# Patient Record
Sex: Male | Born: 2000
Health system: Southern US, Community
[De-identification: ages and names within clinical notes are randomized; demographics above are authoritative.]

## PROBLEM LIST (undated history)

## (undated) DIAGNOSIS — J45909 Unspecified asthma, uncomplicated: Secondary | ICD-10-CM

## (undated) DIAGNOSIS — E213 Hyperparathyroidism, unspecified: Secondary | ICD-10-CM

## (undated) DIAGNOSIS — F329 Major depressive disorder, single episode, unspecified: Secondary | ICD-10-CM

## (undated) HISTORY — DX: Unspecified asthma, uncomplicated: J45.909

## (undated) HISTORY — PX: THYROID LOBECTOMY: SHX420

## (undated) HISTORY — DX: Hyperparathyroidism, unspecified: E21.3

## (undated) HISTORY — PX: PARATHYROIDECTOMY: SHX19

---
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
## 2022-02-08 ENCOUNTER — Other Ambulatory Visit: Payer: Self-pay | Admitting: Family Medicine

## 2022-07-09 ENCOUNTER — Other Ambulatory Visit: Payer: Self-pay | Admitting: Family Medicine

## 2022-07-10 NOTE — Telephone Encounter (Signed)
Patient scheduled.

## 2022-07-10 NOTE — Telephone Encounter (Signed)
Please schedule CPE with Dr. Para March after 07/26/22.

## 2022-07-10 NOTE — Telephone Encounter (Signed)
LVM for patient to call back and schedule

## 2022-08-10 ENCOUNTER — Encounter: Payer: Self-pay | Admitting: Family Medicine

## 2022-08-10 ENCOUNTER — Ambulatory Visit (INDEPENDENT_AMBULATORY_CARE_PROVIDER_SITE_OTHER): Payer: BC Managed Care – PPO | Admitting: Family Medicine

## 2022-08-10 VITALS — BP 124/70 | HR 82 | Temp 98.2°F | Ht 71.11 in | Wt 202.0 lb

## 2022-08-10 DIAGNOSIS — Z0000 Encounter for general adult medical examination without abnormal findings: Secondary | ICD-10-CM

## 2022-08-10 DIAGNOSIS — E039 Hypothyroidism, unspecified: Secondary | ICD-10-CM

## 2022-08-10 DIAGNOSIS — E210 Primary hyperparathyroidism: Secondary | ICD-10-CM | POA: Diagnosis not present

## 2022-08-10 DIAGNOSIS — Z7189 Other specified counseling: Secondary | ICD-10-CM

## 2022-08-10 NOTE — Patient Instructions (Addendum)
Go to the lab on the way out.   If you have mychart we'll likely use that to update you.    Take care.  Glad to see you. I would get a flu shot each fall.   

## 2022-08-10 NOTE — Progress Notes (Signed)
CPE- See plan.  Routine anticipatory guidance given to patient.  See health maintenance.  The possibility exists that previously documented standard health maintenance information may have been brought forward from a previous encounter into this note.  If needed, that same information has been updated to reflect the current situation based on today's encounter.     Tetanus 2016 Flu d/w pt.  PNA and shingles not due.  Colon and prostate screening not due.   Living will d/w pt.  Mother designated if patient were incapacitated.   Diet and exercise d/w pt.     Hyperparathyroid hx.  Calcium and PTH pending.  D/w pt.     Hypothyroidism.  Taking med in AM on empty stomach.  Compliant.  He doesn't usually miss a dose.  No neck mass, no dysphagia.  No sig fatigue, at baseline.  TSH pending.   See exam regarding pain concern. L 3rd prox phalanx discomfort w/o tendon deficit.   L>R 2nd finger PIP dorsal skin thickening.  I asked him to update me as needed.   PMH and SH reviewed   Meds, vitals, and allergies reviewed.    ROS: Per HPI.  Unless specifically indicated otherwise in HPI, the patient denies:   General: fever. Eyes: acute vision changes ENT: sore throat Cardiovascular: chest pain Respiratory: SOB GI: vomiting GU: dysuria Musculoskeletal: acute back pain Derm: acute rash Neuro: acute motor dysfunction Psych: worsening mood Endocrine: polydipsia Heme: bleeding Allergy: hayfever   GEN: nad, alert and oriented HEENT: mucous membranes moist NECK: supple w/o LA CV: rrr. PULM: ctab, no inc wob ABD: soft, +bs EXT: no edema SKIN: no acute rash L 3rd prox phalanx discomfort w/o tendon deficit.   L>R 2nd finger PIP dorsal skin thickening without ulceration. No contracture.  No erythema or bruising.  No joint line pain.

## 2022-08-13 NOTE — Assessment & Plan Note (Signed)
Tetanus 2016 Flu d/w pt.  PNA and shingles not due.  Colon and prostate screening not due.   Living will d/w pt.  Mother designated if patient were incapacitated.   Diet and exercise d/w pt.

## 2022-08-13 NOTE — Assessment & Plan Note (Signed)
History of, calcium and PTH pending.  D/w pt.

## 2022-08-13 NOTE — Assessment & Plan Note (Signed)
Living will d/w pt. Mother designated if patient were incapacitated.  

## 2022-08-13 NOTE — Assessment & Plan Note (Addendum)
Continue levothyroxine.  See notes on labs. 

## 2022-11-07 ENCOUNTER — Other Ambulatory Visit: Payer: Self-pay | Admitting: Family Medicine

## 2023-08-02 ENCOUNTER — Other Ambulatory Visit: Payer: Self-pay | Admitting: Family Medicine

## 2023-08-02 DIAGNOSIS — E039 Hypothyroidism, unspecified: Secondary | ICD-10-CM

## 2023-08-03 NOTE — Telephone Encounter (Signed)
 E-scribed refill  Plz schedule CPE and fasting labs (no food/drink- except water and/or blk coffee 5 hrs prior) for additional refills.

## 2023-08-20 ENCOUNTER — Other Ambulatory Visit: Payer: Self-pay | Admitting: Family Medicine

## 2023-08-20 DIAGNOSIS — E039 Hypothyroidism, unspecified: Secondary | ICD-10-CM

## 2023-08-20 DIAGNOSIS — E210 Primary hyperparathyroidism: Secondary | ICD-10-CM

## 2023-08-31 ENCOUNTER — Other Ambulatory Visit (INDEPENDENT_AMBULATORY_CARE_PROVIDER_SITE_OTHER)

## 2023-08-31 DIAGNOSIS — E210 Primary hyperparathyroidism: Secondary | ICD-10-CM

## 2023-08-31 DIAGNOSIS — E039 Hypothyroidism, unspecified: Secondary | ICD-10-CM | POA: Diagnosis not present

## 2023-08-31 LAB — BASIC METABOLIC PANEL WITH GFR
BUN: 13 mg/dL (ref 6–23)
CO2: 28 meq/L (ref 19–32)
Calcium: 9.6 mg/dL (ref 8.4–10.5)
Chloride: 101 meq/L (ref 96–112)
Creatinine, Ser: 1.08 mg/dL (ref 0.40–1.50)
GFR: 98.55 mL/min (ref >60.00)
Glucose, Bld: 91 mg/dL (ref 70–99)
Potassium: 3.9 meq/L (ref 3.5–5.1)
Sodium: 139 meq/L (ref 135–145)

## 2023-08-31 LAB — TSH: TSH: 1.01 u[IU]/mL (ref 0.35–5.50)

## 2023-08-31 LAB — VITAMIN D 25 HYDROXY (VIT D DEFICIENCY, FRACTURES): VITD: 21.74 ng/mL — ABNORMAL LOW (ref 30.00–100.00)

## 2023-09-01 LAB — PTH, INTACT AND CALCIUM
Calcium: 9.9 mg/dL (ref 8.6–10.3)
PTH: 10 pg/mL — ABNORMAL LOW (ref 16–77)

## 2023-09-03 ENCOUNTER — Ambulatory Visit: Payer: Self-pay | Admitting: Family Medicine

## 2023-09-07 ENCOUNTER — Encounter: Payer: Self-pay | Admitting: Family Medicine

## 2023-09-07 ENCOUNTER — Ambulatory Visit (INDEPENDENT_AMBULATORY_CARE_PROVIDER_SITE_OTHER): Admitting: Family Medicine

## 2023-09-07 VITALS — BP 128/66 | HR 103 | Temp 98.1°F | Ht 70.5 in | Wt 205.2 lb

## 2023-09-07 DIAGNOSIS — Z8639 Personal history of other endocrine, nutritional and metabolic disease: Secondary | ICD-10-CM

## 2023-09-07 DIAGNOSIS — E039 Hypothyroidism, unspecified: Secondary | ICD-10-CM

## 2023-09-07 DIAGNOSIS — Z0000 Encounter for general adult medical examination without abnormal findings: Secondary | ICD-10-CM

## 2023-09-07 DIAGNOSIS — Z7189 Other specified counseling: Secondary | ICD-10-CM

## 2023-09-07 DIAGNOSIS — E559 Vitamin D deficiency, unspecified: Secondary | ICD-10-CM

## 2023-09-07 MED ORDER — LEVOTHYROXINE SODIUM 75 MCG PO TABS: ORAL_TABLET | ORAL | 3 refills | Status: AC

## 2023-09-07 NOTE — Patient Instructions (Signed)
 Restart vitamin D  and recheck nonfasting labs in about 3 months.  Take care.  Glad to see you. I would get a flu shot each fall.   Recheck yearly.

## 2023-09-07 NOTE — Progress Notes (Signed)
 CPE- See plan.  Routine anticipatory guidance given to patient.  See health maintenance.  The possibility exists that previously documented standard health maintenance information may have been brought forward from a previous encounter into this note.  If needed, that same information has been updated to reflect the current situation based on today's encounter.    Tetanus 2016 Flu d/w pt. encouraged. PNA and shingles not due.  Colon and prostate screening not due.   Living will d/w pt.  Mother designated if patient were incapacitated.   Diet and exercise d/w pt.    Mildly low PTH with normal calcium .  History of hyperparathyroidism previously corrected with surgery.  Labs discussed with patient.  He is feeling well otherwise.  Low vit D.  He had missed some doses, d/w pt about options.    Still on levothyroxine .  TSH wnl.  Compliant.   No ADE on med.  No neck mass noted.  PMH and SH reviewed  Meds, vitals, and allergies reviewed.   ROS: Per HPI.  Unless specifically indicated otherwise in HPI, the patient denies:  General: fever. Eyes: acute vision changes ENT: sore throat Cardiovascular: chest pain Respiratory: SOB GI: vomiting GU: dysuria Musculoskeletal: acute back pain Derm: acute rash Neuro: acute motor dysfunction Psych: worsening mood Endocrine: polydipsia Heme: bleeding Allergy: hayfever  GEN: nad, alert and oriented HEENT: mucous membranes moist NECK: supple w/o LA CV: rrr. PULM: ctab, no inc wob ABD: soft, +bs EXT: no edema SKIN: no acute rash

## 2023-09-08 DIAGNOSIS — E559 Vitamin D deficiency, unspecified: Secondary | ICD-10-CM | POA: Insufficient documentation

## 2023-09-08 NOTE — Assessment & Plan Note (Signed)
 Normal calcium  level today.  PTH is close to normal.  No change in medications at this point.

## 2023-09-08 NOTE — Assessment & Plan Note (Signed)
Living will d/w pt. Mother designated if patient were incapacitated.  

## 2023-09-08 NOTE — Assessment & Plan Note (Signed)
 Tetanus 2016 Flu d/w pt. encouraged. PNA and shingles not due.  Colon and prostate screening not due.   Living will d/w pt.  Mother designated if patient were incapacitated.   Diet and exercise d/w pt.

## 2023-09-08 NOTE — Assessment & Plan Note (Signed)
 Low vit D.  He had missed some doses, d/w pt about restart vitamin D .  Can recheck vitamin D  level in about 3 months.SABRA

## 2023-09-08 NOTE — Assessment & Plan Note (Signed)
 Continue levothyroxine .  TSH normal.  Continue as is.  He agrees to plan.
